# Patient Record
Sex: Male | Born: 1985 | Hispanic: Yes | Marital: Single | Smoking: Never smoker
Health system: Southern US, Community
[De-identification: ages and names within clinical notes are randomized; demographics above are authoritative.]

---

## 2016-08-08 ENCOUNTER — Other Ambulatory Visit: Payer: Self-pay

## 2016-08-08 ENCOUNTER — Encounter (HOSPITAL_COMMUNITY): Payer: Self-pay

## 2016-08-08 ENCOUNTER — Encounter (HOSPITAL_COMMUNITY): Payer: Self-pay | Admitting: Emergency Medicine

## 2016-08-08 ENCOUNTER — Emergency Department (HOSPITAL_COMMUNITY)
Admission: EM | Admit: 2016-08-08 | Discharge: 2016-08-08 | Disposition: A | Discharge status: Home / Self Care | Payer: Self-pay | Attending: Emergency Medicine | Admitting: Emergency Medicine

## 2016-08-08 ENCOUNTER — Emergency Department (HOSPITAL_COMMUNITY)
Admission: EM | Admit: 2016-08-08 | Discharge: 2016-08-09 | Disposition: A | Discharge status: Home / Self Care | Payer: Self-pay | Attending: Emergency Medicine | Admitting: Emergency Medicine

## 2016-08-08 ENCOUNTER — Emergency Department (HOSPITAL_COMMUNITY): Payer: Self-pay

## 2016-08-08 DIAGNOSIS — R0600 Dyspnea, unspecified: Secondary | ICD-10-CM | POA: Insufficient documentation

## 2016-08-08 DIAGNOSIS — B349 Viral infection, unspecified: Secondary | ICD-10-CM | POA: Insufficient documentation

## 2016-08-08 DIAGNOSIS — Z5321 Procedure and treatment not carried out due to patient leaving prior to being seen by health care provider: Secondary | ICD-10-CM | POA: Insufficient documentation

## 2016-08-08 DIAGNOSIS — R0602 Shortness of breath: Secondary | ICD-10-CM | POA: Insufficient documentation

## 2016-08-08 LAB — BASIC METABOLIC PANEL
ANION GAP: 14 (ref 5–15)
BUN: 8 mg/dL (ref 6–20)
CHLORIDE: 99 mmol/L — AB (ref 101–111)
CO2: 23 mmol/L (ref 22–32)
Calcium: 10.2 mg/dL (ref 8.9–10.3)
Creatinine, Ser: 1.01 mg/dL (ref 0.61–1.24)
GFR calc Af Amer: >60 mL/min (ref >60)
GFR calc non Af Amer: >60 mL/min (ref >60)
GLUCOSE: 120 mg/dL — AB (ref 65–99)
POTASSIUM: 3.4 mmol/L — AB (ref 3.5–5.1)
Sodium: 136 mmol/L (ref 135–145)

## 2016-08-08 LAB — CBC
HEMATOCRIT: 46.4 % (ref 39.0–52.0)
HEMOGLOBIN: 16.3 g/dL (ref 13.0–17.0)
MCH: 31.3 pg (ref 26.0–34.0)
MCHC: 35.1 g/dL (ref 30.0–36.0)
MCV: 89.2 fL (ref 78.0–100.0)
Platelets: 307 10*3/uL (ref 150–400)
RBC: 5.2 MIL/uL (ref 4.22–5.81)
RDW: 13.4 % (ref 11.5–15.5)
WBC: 11.9 10*3/uL — ABNORMAL HIGH (ref 4.0–10.5)

## 2016-08-08 LAB — I-STAT TROPONIN, ED: Troponin i, poc: 0 ng/mL (ref 0.00–0.08)

## 2016-08-08 NOTE — ED Triage Notes (Signed)
Pt states that since last night he has had SOB. Denies URI. Alert and oriented. 97% RA.

## 2016-08-08 NOTE — ED Triage Notes (Signed)
Onset yesterday shortness of breath, worse today.   No cough/cold symptoms, fever, or chest pain.

## 2016-08-09 MED ORDER — POTASSIUM CHLORIDE CRYS ER 20 MEQ PO TBCR
40.0000 meq | EXTENDED_RELEASE_TABLET | Freq: Once | ORAL | Status: AC
  Administered 2016-08-09: 40 meq via ORAL
  Filled 2016-08-09: qty 2
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
## 2016-08-09 NOTE — ED Notes (Signed)
Pt understood dc material. NAD Noted 

## 2020-10-20 ENCOUNTER — Other Ambulatory Visit: Payer: Self-pay

## 2020-10-20 ENCOUNTER — Emergency Department (HOSPITAL_COMMUNITY)
Admission: EM | Admit: 2020-10-20 | Discharge: 2020-10-20 | Discharge status: Against Medical Advice | Payer: HRSA Program | Attending: Emergency Medicine | Admitting: Emergency Medicine

## 2020-10-20 DIAGNOSIS — Z5321 Procedure and treatment not carried out due to patient leaving prior to being seen by health care provider: Secondary | ICD-10-CM | POA: Insufficient documentation

## 2020-10-20 DIAGNOSIS — U071 COVID-19: Secondary | ICD-10-CM | POA: Insufficient documentation

## 2020-10-20 DIAGNOSIS — J029 Acute pharyngitis, unspecified: Secondary | ICD-10-CM | POA: Diagnosis not present

## 2020-10-20 DIAGNOSIS — R059 Cough, unspecified: Secondary | ICD-10-CM | POA: Diagnosis not present

## 2020-10-20 LAB — POC SARS CORONAVIRUS 2 AG -  ED: SARS Coronavirus 2 Ag: POSITIVE — AB

## 2020-10-20 NOTE — ED Notes (Signed)
Called pt name x4 for VS. No response from pt.

## 2020-10-20 NOTE — ED Triage Notes (Signed)
Pt reports sore throat, body aches, and dry cough since this morning.

## 2020-11-17 ENCOUNTER — Emergency Department (HOSPITAL_COMMUNITY): Admission: EM | Admit: 2020-11-17 | Discharge: 2020-11-17 | Discharge status: Against Medical Advice | Payer: Self-pay

## 2020-11-17 ENCOUNTER — Other Ambulatory Visit: Payer: Self-pay

## 2020-11-17 NOTE — ED Notes (Signed)
Pt called three different times for triage, no answer

## 2021-05-09 ENCOUNTER — Emergency Department (HOSPITAL_COMMUNITY): Payer: Self-pay

## 2021-05-09 ENCOUNTER — Emergency Department (HOSPITAL_COMMUNITY)
Admission: EM | Admit: 2021-05-09 | Discharge: 2021-05-10 | Disposition: A | Discharge status: Home / Self Care | Payer: Self-pay | Attending: Physician Assistant | Admitting: Physician Assistant

## 2021-05-09 ENCOUNTER — Other Ambulatory Visit: Payer: Self-pay

## 2021-05-09 DIAGNOSIS — R1011 Right upper quadrant pain: Secondary | ICD-10-CM | POA: Insufficient documentation

## 2021-05-09 DIAGNOSIS — Z5321 Procedure and treatment not carried out due to patient leaving prior to being seen by health care provider: Secondary | ICD-10-CM | POA: Insufficient documentation

## 2021-05-09 DIAGNOSIS — R6883 Chills (without fever): Secondary | ICD-10-CM | POA: Insufficient documentation

## 2021-05-09 DIAGNOSIS — R112 Nausea with vomiting, unspecified: Secondary | ICD-10-CM | POA: Insufficient documentation

## 2021-05-09 LAB — CBC WITH DIFFERENTIAL/PLATELET
Abs Immature Granulocytes: 0.03 10*3/uL (ref 0.00–0.07)
Basophils Absolute: 0.1 10*3/uL (ref 0.0–0.1)
Basophils Relative: 1 %
Eosinophils Absolute: 0.4 10*3/uL (ref 0.0–0.5)
Eosinophils Relative: 4 %
HCT: 42.3 % (ref 39.0–52.0)
Hemoglobin: 13.6 g/dL (ref 13.0–17.0)
Immature Granulocytes: 0 %
Lymphocytes Relative: 21 %
Lymphs Abs: 2 10*3/uL (ref 0.7–4.0)
MCH: 29.4 pg (ref 26.0–34.0)
MCHC: 32.2 g/dL (ref 30.0–36.0)
MCV: 91.4 fL (ref 80.0–100.0)
Monocytes Absolute: 1 10*3/uL (ref 0.1–1.0)
Monocytes Relative: 11 %
Neutro Abs: 6 10*3/uL (ref 1.7–7.7)
Neutrophils Relative %: 63 %
Platelets: 285 10*3/uL (ref 150–400)
RBC: 4.63 MIL/uL (ref 4.22–5.81)
RDW: 14.7 % (ref 11.5–15.5)
WBC: 9.5 10*3/uL (ref 4.0–10.5)
nRBC: 0 % (ref 0.0–0.2)

## 2021-05-09 LAB — URINALYSIS, ROUTINE W REFLEX MICROSCOPIC
Bilirubin Urine: NEGATIVE
Glucose, UA: NEGATIVE mg/dL
Hgb urine dipstick: NEGATIVE
Ketones, ur: NEGATIVE mg/dL
Leukocytes,Ua: NEGATIVE
Nitrite: NEGATIVE
Protein, ur: NEGATIVE mg/dL
Specific Gravity, Urine: 1.023 (ref 1.005–1.030)
pH: 6 (ref 5.0–8.0)

## 2021-05-09 LAB — COMPREHENSIVE METABOLIC PANEL
ALT: 312 U/L — ABNORMAL HIGH (ref 0–44)
AST: 233 U/L — ABNORMAL HIGH (ref 15–41)
Albumin: 3.6 g/dL (ref 3.5–5.0)
Alkaline Phosphatase: 67 U/L (ref 38–126)
Anion gap: 9 (ref 5–15)
BUN: 8 mg/dL (ref 6–20)
CO2: 23 mmol/L (ref 22–32)
Calcium: 8.9 mg/dL (ref 8.9–10.3)
Chloride: 104 mmol/L (ref 98–111)
Creatinine, Ser: 0.78 mg/dL (ref 0.61–1.24)
GFR, Estimated: >60 mL/min (ref >60)
Glucose, Bld: 132 mg/dL — ABNORMAL HIGH (ref 70–99)
Potassium: 3.4 mmol/L — ABNORMAL LOW (ref 3.5–5.1)
Sodium: 136 mmol/L (ref 135–145)
Total Bilirubin: 0.8 mg/dL (ref 0.3–1.2)
Total Protein: 7.2 g/dL (ref 6.5–8.1)

## 2021-05-09 LAB — LIPASE, BLOOD: Lipase: 42 U/L (ref 11–51)

## 2021-05-09 NOTE — ED Provider Notes (Signed)
Emergency Medicine Provider Triage Evaluation Note  Kyle Perkins , a 35 y.o. male  was evaluated in triage.  Pt complains of upper abdominal pain.  Patient states he had an episode of abdominal pain last Saturday.  States that he did not think anything of it and his symptoms resolved.  Yesterday began experiencing a similar pain.  It has been constant but worsens after eating as well.  Endorses intermittent nausea/vomiting as well as chills when his pain worsens.  No fevers, diarrhea, lower abdominal pain.  Denies regular NSAID use or alcohol use.  Physical Exam  Ht 5\' 6"  (1.676 m)   Wt 75 kg   BMI 26.69 kg/m  Gen:   Awake, no distress   Resp:  Normal effort  MSK:   Moves extremities without difficulty  Other:  Abdomen is flat and soft.  Moderate tenderness noted diffusely across the upper abdomen.  Medical Decision Making  Medically screening exam initiated at 7:29 PM.  Appropriate orders placed.  Kyle Perkins was informed that the remainder of the evaluation will be completed by another provider, this initial triage assessment does not replace that evaluation, and the importance of remaining in the ED until their evaluation is complete.   Georgia Dom, PA-C 05/09/21 1930    05/11/21, MD 05/12/21 [phone]

## 2021-05-09 NOTE — ED Triage Notes (Signed)
Pt arrives POV for eval of abd pain x 1 week, worse w/ eating, had one episode last Saturday, and has been constant in nature since yesterday afternoon. Endorses N/V, chills.

## 2021-05-10 NOTE — ED Notes (Signed)
Pt called 2x no answer 

## 2021-05-10 NOTE — ED Notes (Signed)
Pt called 3x no answer  

## 2022-12-26 IMAGING — US US ABDOMEN LIMITED
1 series · 14 of 25 positions shown · non-contrast
Comparison: None.

CLINICAL DATA: Right upper quadrant pain x1 week.

EXAM:
ULTRASOUND ABDOMEN LIMITED RIGHT UPPER QUADRANT

[Series 1: us abdomen limited ruq (liver/gb) · 14 of 55 slices shown]
[im 1/55]
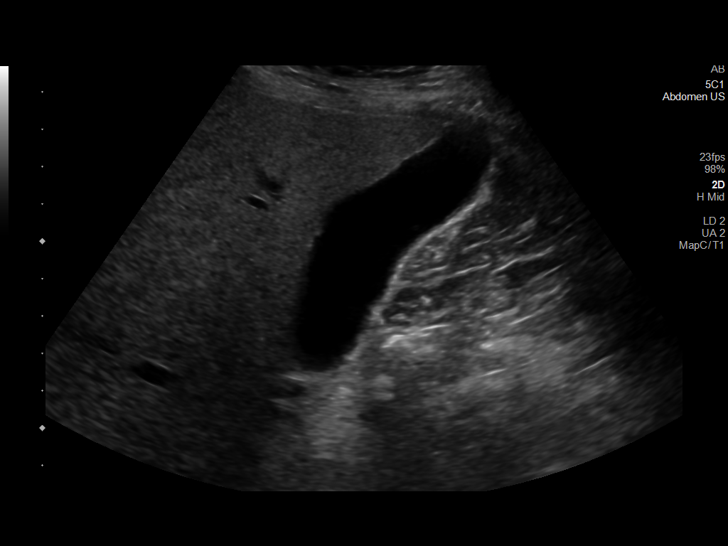
[im 5/55]
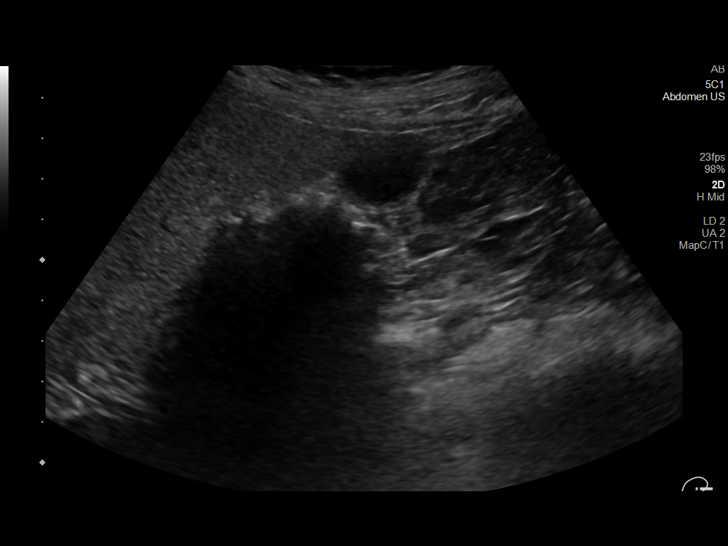
[im 10/55]
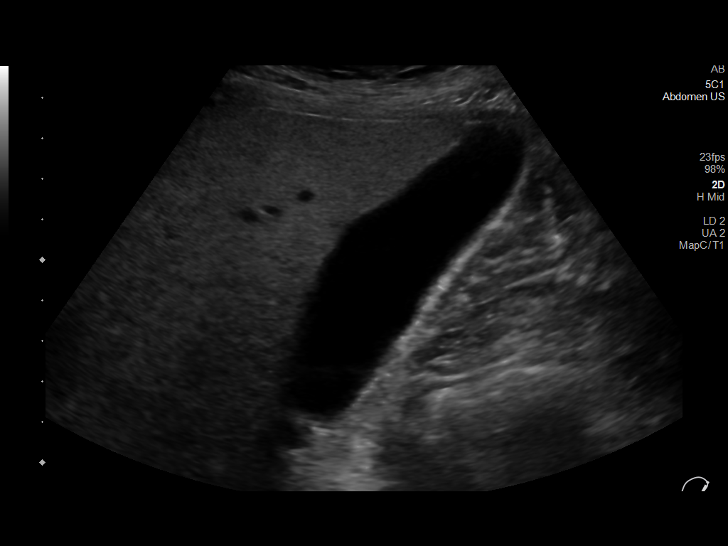
[im 14/55]
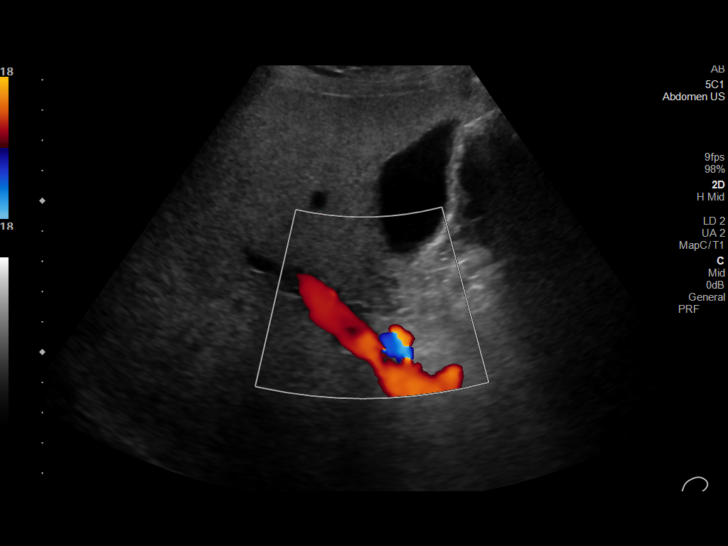
[im 19/55]
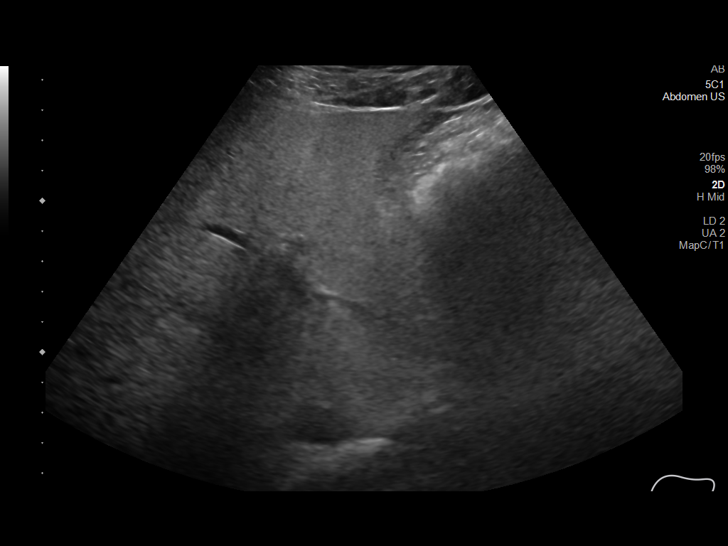
[im 21/55]
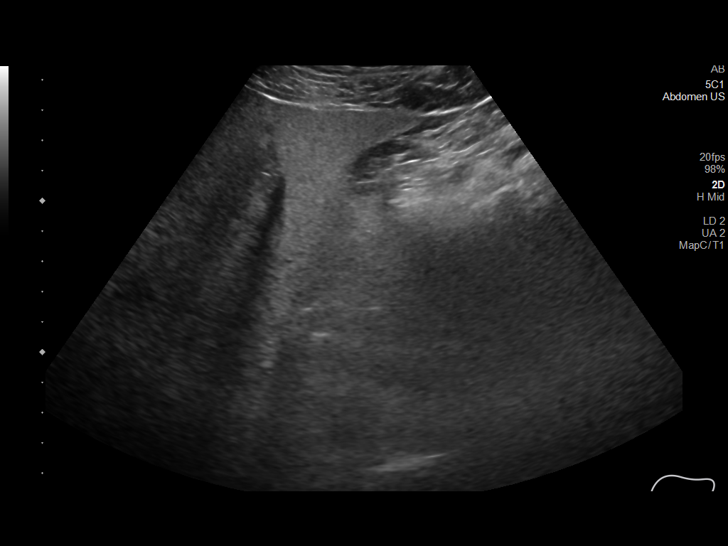
[im 25/55]
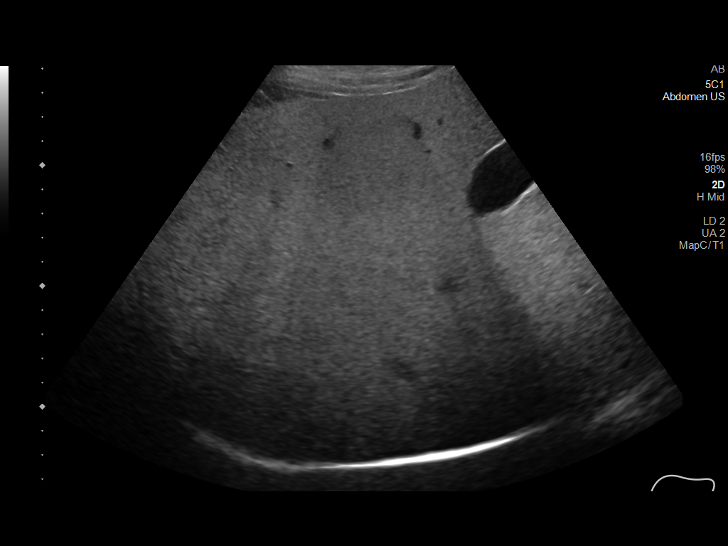
[im 30/55]
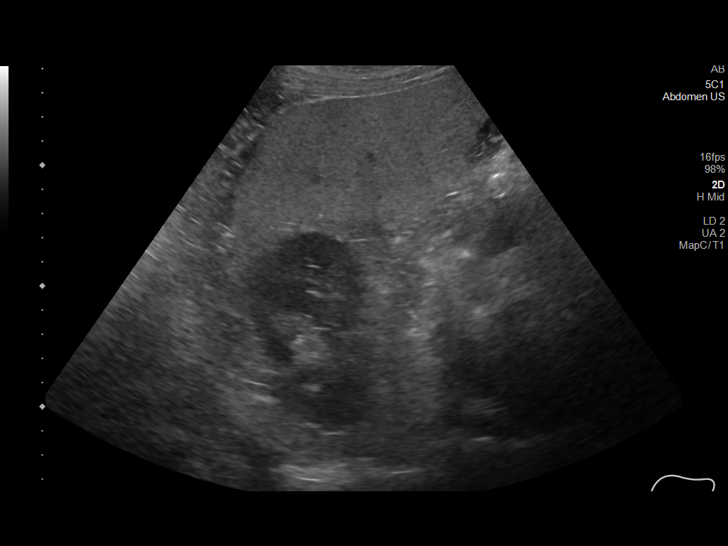
[im 34/55]
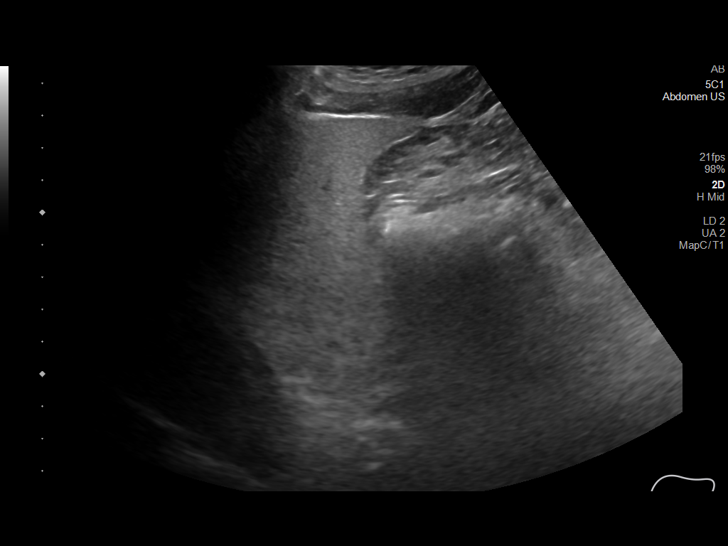
[im 37/55]
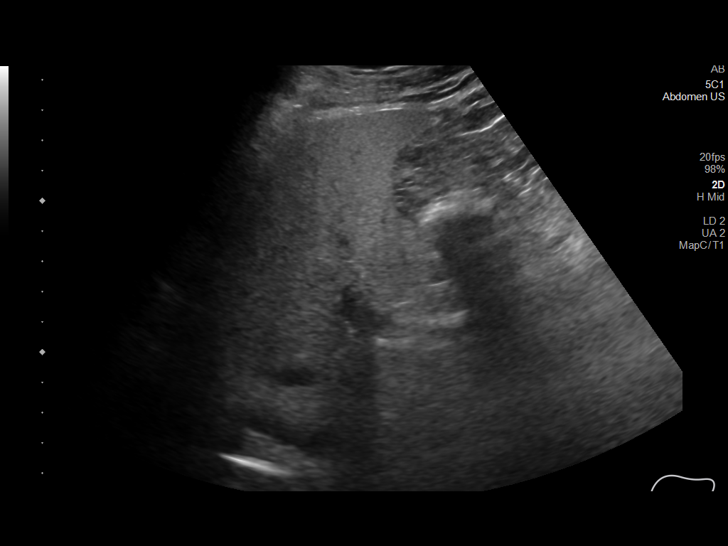
[im 41/55]
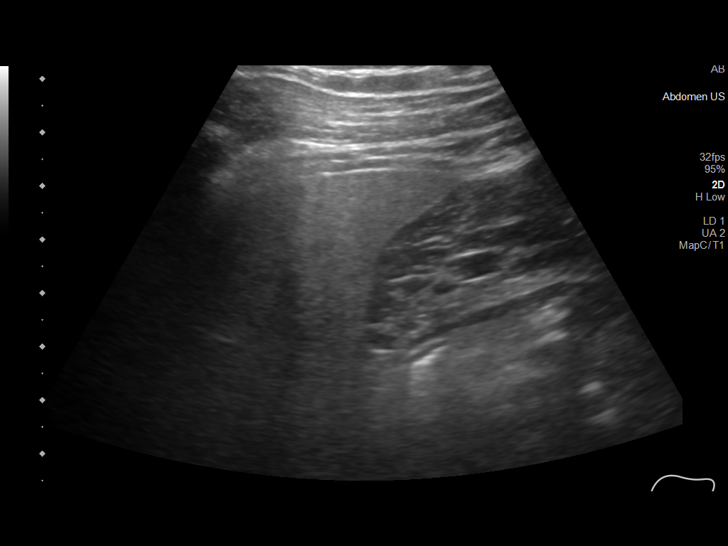
[im 46/55]
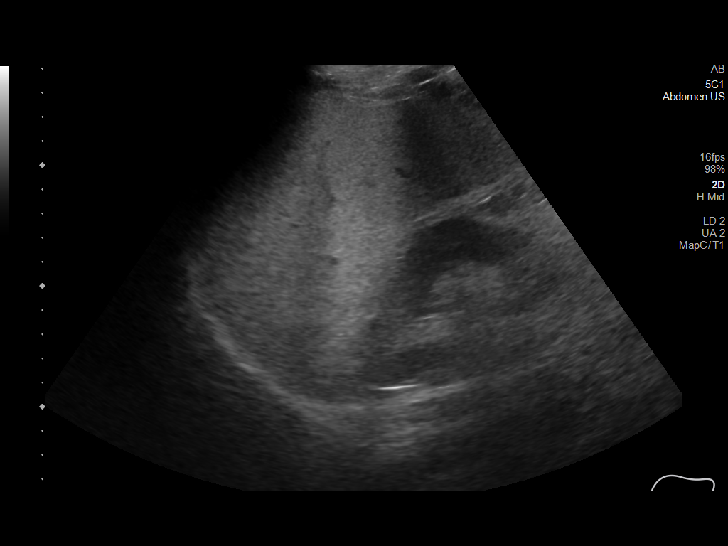
[im 50/55]
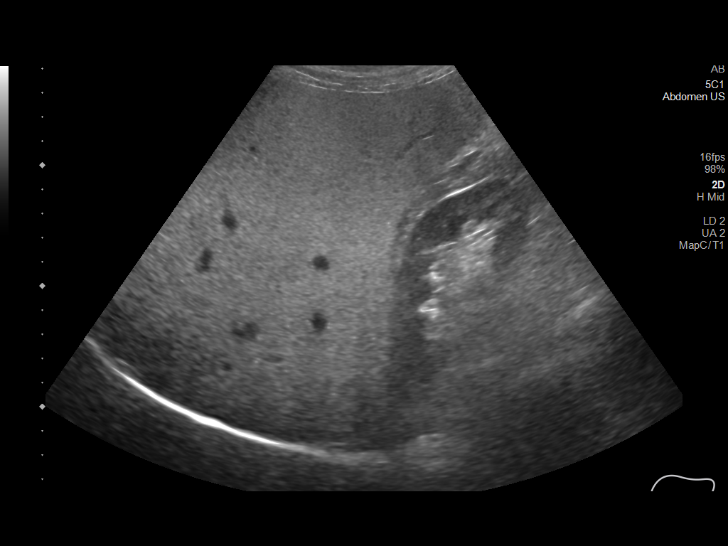
[im 55/55]
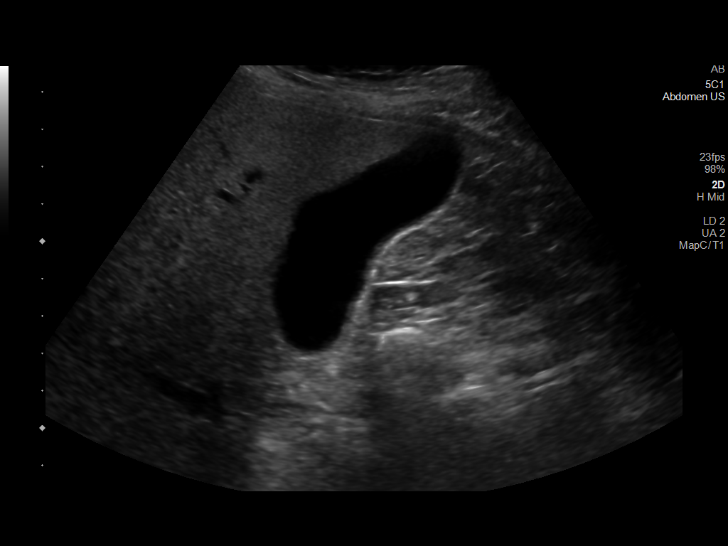

[14 of 25 positions shown; findings below may reference images not displayed]

FINDINGS: Gallbladder:

No gallstones or wall thickening visualized (1.2 mm). No sonographic
Murphy sign noted by sonographer.

Common bile duct:

Diameter: 3.1 mm

Liver:

No focal lesion identified. Diffusely increased echogenicity of the
liver parenchyma is noted. Portal vein is patent on color Doppler
imaging with normal direction of blood flow towards the liver.

Other: None.
IMPRESSION: Fatty liver.

## 2023-06-05 ENCOUNTER — Emergency Department (HOSPITAL_COMMUNITY)
Admission: EM | Admit: 2023-06-05 | Discharge: 2023-06-05 | Disposition: A | Discharge status: Home / Self Care | Payer: Self-pay | Attending: Emergency Medicine | Admitting: Emergency Medicine

## 2023-06-05 ENCOUNTER — Encounter (HOSPITAL_COMMUNITY): Payer: Self-pay

## 2023-06-05 ENCOUNTER — Other Ambulatory Visit: Payer: Self-pay

## 2023-06-05 DIAGNOSIS — K920 Hematemesis: Secondary | ICD-10-CM | POA: Insufficient documentation

## 2023-06-05 DIAGNOSIS — F1099 Alcohol use, unspecified with unspecified alcohol-induced disorder: Secondary | ICD-10-CM | POA: Insufficient documentation

## 2023-06-05 DIAGNOSIS — Z789 Other specified health status: Secondary | ICD-10-CM

## 2023-06-05 DIAGNOSIS — R1013 Epigastric pain: Secondary | ICD-10-CM | POA: Insufficient documentation

## 2023-06-05 LAB — HEPATIC FUNCTION PANEL
ALT: 95 U/L — ABNORMAL HIGH (ref 0–44)
AST: 233 U/L — ABNORMAL HIGH (ref 15–41)
Albumin: 3.3 g/dL — ABNORMAL LOW (ref 3.5–5.0)
Alkaline Phosphatase: 111 U/L (ref 38–126)
Bilirubin, Direct: 0.3 mg/dL — ABNORMAL HIGH (ref 0.0–0.2)
Indirect Bilirubin: 0.7 mg/dL (ref 0.3–0.9)
Total Bilirubin: 1 mg/dL (ref 0.3–1.2)
Total Protein: 8.7 g/dL — ABNORMAL HIGH (ref 6.5–8.1)

## 2023-06-05 LAB — BASIC METABOLIC PANEL
Anion gap: 14 (ref 5–15)
BUN: 5 mg/dL — ABNORMAL LOW (ref 6–20)
CO2: 23 mmol/L (ref 22–32)
Calcium: 9.2 mg/dL (ref 8.9–10.3)
Chloride: 103 mmol/L (ref 98–111)
Creatinine, Ser: 0.95 mg/dL (ref 0.61–1.24)
GFR, Estimated: >60 mL/min (ref >60)
Glucose, Bld: 105 mg/dL — ABNORMAL HIGH (ref 70–99)
Potassium: 4.1 mmol/L (ref 3.5–5.1)
Sodium: 140 mmol/L (ref 135–145)

## 2023-06-05 LAB — URINALYSIS, ROUTINE W REFLEX MICROSCOPIC
Bilirubin Urine: NEGATIVE
Glucose, UA: NEGATIVE mg/dL
Hgb urine dipstick: NEGATIVE
Ketones, ur: NEGATIVE mg/dL
Nitrite: NEGATIVE
Protein, ur: NEGATIVE mg/dL
Specific Gravity, Urine: 1.017 (ref 1.005–1.030)
pH: 6 (ref 5.0–8.0)

## 2023-06-05 LAB — CBC WITH DIFFERENTIAL/PLATELET
Abs Immature Granulocytes: 0.03 10*3/uL (ref 0.00–0.07)
Basophils Absolute: 0.2 10*3/uL — ABNORMAL HIGH (ref 0.0–0.1)
Basophils Relative: 2 %
Eosinophils Absolute: 0.4 10*3/uL (ref 0.0–0.5)
Eosinophils Relative: 5 %
HCT: 43.5 % (ref 39.0–52.0)
Hemoglobin: 14.5 g/dL (ref 13.0–17.0)
Immature Granulocytes: 0 %
Lymphocytes Relative: 19 %
Lymphs Abs: 1.6 10*3/uL (ref 0.7–4.0)
MCH: 31.7 pg (ref 26.0–34.0)
MCHC: 33.3 g/dL (ref 30.0–36.0)
MCV: 95 fL (ref 80.0–100.0)
Monocytes Absolute: 0.9 10*3/uL (ref 0.1–1.0)
Monocytes Relative: 11 %
Neutro Abs: 5.5 10*3/uL (ref 1.7–7.7)
Neutrophils Relative %: 63 %
Platelets: 228 10*3/uL (ref 150–400)
RBC: 4.58 MIL/uL (ref 4.22–5.81)
RDW: 13.7 % (ref 11.5–15.5)
WBC: 8.6 10*3/uL (ref 4.0–10.5)
nRBC: 0 % (ref 0.0–0.2)

## 2023-06-05 LAB — LIPASE, BLOOD: Lipase: 46 U/L (ref 11–51)

## 2023-06-05 MED ORDER — ONDANSETRON 4 MG PO TBDP
4.0000 mg | ORAL_TABLET | Freq: Once | ORAL | Status: AC
  Administered 2023-06-05: 4 mg via ORAL
  Filled 2023-06-05: qty 1

## 2023-06-05 MED ORDER — PANTOPRAZOLE SODIUM 40 MG PO TBEC
40.0000 mg | DELAYED_RELEASE_TABLET | Freq: Once | ORAL | Status: AC
  Administered 2023-06-05: 40 mg via ORAL
  Filled 2023-06-05: qty 1

## 2023-06-05 MED ORDER — FAMOTIDINE 20 MG PO TABS: 20.0000 mg | ORAL_TABLET | Freq: Two times a day (BID) | ORAL | 0 refills | Status: AC

## 2023-06-05 MED ORDER — PANTOPRAZOLE SODIUM 20 MG PO TBEC: 20.0000 mg | DELAYED_RELEASE_TABLET | Freq: Every day | ORAL | 1 refills | Status: AC

## 2023-06-05 MED ORDER — ONDANSETRON 4 MG PO TBDP: 4.0000 mg | ORAL_TABLET | Freq: Three times a day (TID) | ORAL | 0 refills | Status: AC | PRN

## 2023-06-05 NOTE — ED Provider Notes (Signed)
[place] EMERGENCY DEPARTMENT AT [facility] Provider Note   CSN: [id number] Arrival date & time: 06/05/23  1143     History  No chief complaint on file.   Kyle Perkins is a 37 y.o. male.  Patient with history of heavy alcohol use presents the emergency department for vomiting including hematemesis.  Patient has been vomiting over the past several days.  Last vomited blood, approximately 2 tablespoons, this morning.  He continues to drink alcohol.  He denies abdominal pain.  No fevers.  No treatments for his symptoms.  No history of abdominal surgeries or known peptic ulcers.  Family over states that he has not cut down on drinking alcohol.  Right upper quadrant ultrasound in July 2022 showed hepatic steatosis.  Denies heavy NSAID use.       Home Medications Prior to Admission medications   Not on File      Allergies    Prednisone    Review of Systems   Review of Systems  Physical Exam Updated Vital Signs BP 130/86   Pulse 92   Temp 98.6 F (37 C) (Oral)   Resp 17   Ht 5\' 7"  (1.702 m)   Wt 78.5 kg   SpO2 96%   BMI 27.10 kg/m  Physical Exam Vitals and nursing note reviewed.  Constitutional:      General: He is not in acute distress.    Appearance: He is well-developed.  HENT:     Head: Normocephalic and atraumatic.  Eyes:     General:        Right eye: No discharge.        Left eye: No discharge.     Conjunctiva/sclera: Conjunctivae normal.  Cardiovascular:     Rate and Rhythm: Normal rate and regular rhythm.     Heart sounds: Normal heart sounds.  Pulmonary:     Effort: Pulmonary effort is normal.     Breath sounds: Normal breath sounds.  Abdominal:     Palpations: Abdomen is soft.     Tenderness: There is no abdominal tenderness. There is no guarding or rebound.  Musculoskeletal:     Cervical back: Normal range of motion and neck supple.  Skin:    General: Skin is warm and dry.  Neurological:     Mental Status: He is alert.      ED Results / Procedures / Treatments   Labs (all labs ordered are listed, but only abnormal results are displayed) Labs Reviewed  CBC WITH DIFFERENTIAL/PLATELET - Abnormal; Notable for the following components:      Result Value   Basophils Absolute 0.2 (*)    All other components within normal limits  BASIC METABOLIC PANEL - Abnormal; Notable for the following components:   Glucose, Bld 105 (*)    BUN 5 (*)    All other components within normal limits  LIPASE, BLOOD  URINALYSIS, ROUTINE W REFLEX MICROSCOPIC  HEPATIC FUNCTION PANEL    EKG None  Radiology No results found.  Procedures Procedures    Medications Ordered in ED Medications - No data to display  ED Course/ Medical Decision Making/ A&P    Patient seen and examined. History obtained directly from patient and family member at bedside who interprets, with patient permission.  Labs/EKG: Ordered CBC with normal hemoglobin and normal white blood cell count; BMP with normal electrolytes and kidney function, lipase normal.  Will need to check liver function testing, this was ordered.  Imaging: None ordered  Medications/Fluids: Ordered: ODT  Zofran.   Most recent vital signs reviewed and are as follows: BP 130/86   Pulse 92   Temp 98.6 F (37 C) (Oral)   Resp 17   Ht 5\' 7"  (1.702 m)   Wt 78.5 kg   SpO2 96%   BMI 27.10 kg/m   Initial impression: Patient with likely alcoholic gastritis, possible peptic ulcer disease.  No history of esophageal varices.  Appears quite stable at this time.  Will treat nausea and p.o. challenge.  Awaiting LFTs.  3:27 PM Reassessment performed. Patient appears stable continue to wait LFTs.  Have spoken with lab.   Plan: Signout to [employer] at shift change. Follow-up LFTs.   Anticipate discharge to home.  Prescriptions written for Protonix, Pepcid, Zofran.  I have encouraged patient to gradually cut back on alcohol.                                  Medical Decision  Making Amount and/or Complexity of Data Reviewed Labs: ordered.  Risk Prescription drug management.   For this patient's complaint of abdominal pain, the following conditions were considered on the differential diagnosis: gastritis/PUD, enteritis/duodenitis, appendicitis, cholelithiasis/cholecystitis, cholangitis, pancreatitis, ruptured viscus, colitis, diverticulitis, small/large bowel obstruction, proctitis, cystitis, pyelonephritis, ureteral colic, aortic dissection, aortic aneurysm. Atypical chest etiologies were also considered including ACS, PE, and pneumonia.  Patient with history of fatty liver.  Overall low concern for esophageal varices or large-volume hemorrhage today.  Normal hemoglobin.  Symptoms controlled in the ED.  Will treat symptomatically as this is most likely alcoholic gastritis or potentially peptic ulcer disease.  Patient will need follow-up as outpatient.           Final Clinical Impression(s) / ED Diagnoses Final diagnoses:  Hematemesis with nausea  Epigastric pain  Alcohol use    Rx / DC Orders ED Discharge Orders     None         Renne Crigler, PA-C 06/05/23 1529    Arby Barrette, MD 06/08/23 [phone]

## 2023-06-05 NOTE — ED Triage Notes (Signed)
Pt/translator stated, throwing up blood for a week and half . Denies any other symptoms.

## 2023-06-05 NOTE — ED Notes (Signed)
Patient name was call x3, no response.  Don't see patient in lobby

## 2023-06-05 NOTE — ED Notes (Signed)
No nausea and vomiting reported at this time

## 2023-06-05 NOTE — Discharge Instructions (Addendum)
Please read and follow all provided instructions.  Your diagnoses today include:  1. Hematemesis with nausea   2. Epigastric pain   3. Alcohol use     Tests performed today include: Blood cell counts and platelets Kidney and liver function tests Pancreas function test (called lipase) Urine test to look for infection Vital signs. See below for your results today.   Medications prescribed:  Pantoprazole - stomach acid reducer  Pepcid (famotidine) - antihistamine  Zofran - for nausea   DO NOT exceed:  20mg  Pepcid every 12 hours  Take any prescribed medications only as directed.  Home care instructions:  Follow any educational materials contained in this packet.  Follow-up instructions: Please follow-up with your primary care provider in the next 7 days for further evaluation of your symptoms.  I have also referred you to gastroenterology or the stomach specialists for further evaluation.   Return instructions:  SEEK IMMEDIATE MEDICAL ATTENTION IF: The pain does not go away or becomes severe  A temperature above 101F develops  Repeated vomiting occurs (multiple episodes)  The pain becomes localized to portions of the abdomen. The right side could possibly be appendicitis. In an adult, the left lower portion of the abdomen could be colitis or diverticulitis.  Repeated blood is being passed in stools or vomit (bright red or black tarry stools)  You develop chest pain, difficulty breathing, dizziness or fainting, or become confused, poorly responsive, or inconsolable (young children) If you have any other emergent concerns regarding your health  Additional Information: Abdominal (belly) pain can be caused by many things. Your caregiver performed an examination and possibly ordered blood/urine tests and imaging (CT scan, x-rays, ultrasound). Many cases can be observed and treated at home after initial evaluation in the emergency department. Even though you are being discharged  home, abdominal pain can be unpredictable. Therefore, you need a repeated exam if your pain does not resolve, returns, or worsens. Most patients with abdominal pain don't have to be admitted to the hospital or have surgery, but serious problems like appendicitis and gallbladder attacks can start out as nonspecific pain. Many abdominal conditions cannot be diagnosed in one visit, so follow-up evaluations are very important.  Your vital signs today were: BP 130/86   Pulse 92   Temp 98.6 F (37 C) (Oral)   Resp 17   Ht 5\' 7"  (1.702 m)   Wt 78.5 kg   SpO2 96%   BMI 27.10 kg/m  If your blood pressure (bp) was elevated above 135/85 this visit, please have this repeated by your doctor within one month. --------------  Lea y siga todas las instrucciones proporcionadas.  Sus diagnsticos de hoy incluyen:  1. Hematemesis con nuseas.  2. Dolor epigstrico  3. Consumo de alcohol    Las pruebas realizadas hoy incluyen:  Recuentos de clulas sanguneas y plaquetas.  Pruebas de funcin renal y heptica.  Prueba de funcin del pncreas (llamada lipasa)  Anlisis de orina para buscar infeccin.  Signos vitales. Vea a continuacin sus resultados de [employer].   Medicamentos recetados:   Pantoprazol - reductor de cido del estmago   Pepcid (famotidina) - antihistamnico   Zofran - para las nuseas   NO exceda:   20 mg de Pepcid cada 12 horas  Tome cualquier medicamento recetado slo segn las indicaciones.  Instrucciones de cuidado en el hogar:   Siga todos los materiales educativos contenidos en este paquete.  Instrucciones de seguimiento: Haga un seguimiento con su proveedor de Marine scientist en  los prximos 7 das para una evaluacin adicional de sus sntomas.  Tambin lo he remitido a Glass blower/designer para una evaluacin adicional.   Instrucciones de devolucin:  BUSQUE ATENCIN MDICA INMEDIATA SI:  El dolor no desaparece o se vuelve intenso.   Se  desarrolla una temperatura superior a 101F   Se producen vmitos repetidos (mltiples episodios).   El dolor se localiza en partes del abdomen. El lado derecho posiblemente podra ser apendicitis. En un adulto, la porcin inferior izquierda del abdomen podra ser colitis o diverticulitis.   Se elimina sangre repetidamente en las heces o en el vmito (heces alquitranadas de color rojo brillante o negro).   Presenta dolor en el pecho, dificultad para respirar, mareos o desmayos, o se siente confundido, no responde bien o est inconsolable (nios pequeos).  Si tiene alguna otra inquietud emergente con respecto a su salud  Informacin adicional: El dolor abdominal (vientre) puede deberse a Psychologist, clinical. Su mdico realiz un examen y posiblemente orden anlisis de sangre/orina e imgenes (tomografa computarizada, radiografas, ultrasonido). Muchos casos pueden observarse y tratarse en casa despus de una evaluacin inicial en el departamento de emergencias. Aunque le den el alta, el dolor abdominal puede ser impredecible. Por lo tanto, necesitar repetir el examen si su dolor no se resuelve, regresa o Harrington Challenger. La mayora de los pacientes con dolor abdominal no necesitan ser hospitalizados ni someterse a [place], pero problemas graves como apendicitis y ataques de vescula biliar pueden comenzar como un dolor inespecfico. Muchas afecciones abdominales no se pueden diagnosticar en una sola visita, por lo que las evaluaciones de seguimiento son Engineer, production.  Sus signos vitales hoy fueron: BP 130/86  Pulso 92  Temperatura 37 C (98,6 F) (oral)  Resp. 17  Estatura 5\' 7"  (1,702 m)  Peso 78,5 kg  SpO2 96%  IMC 27,10 kg/m  Si su presin arterial (pb) estuvo elevada por encima de 135/85 en esta visita, haga que su mdico la repita dentro de un mes. --------------

## 2023-06-05 NOTE — ED Provider Notes (Signed)
Accepted handoff at shift change from [facility], [place]. Please see prior provider note for more detail.   Briefly: Patient is 37 y.o. who presented to ED with epigastric pain, nausea, vomiting, one episode very minimal hematemesis. Given Pepcid, Protonix, Zofran. Does have history frequent alcohol use. Well appearing, stable, unremarkable labs. Pending LFTs and anticipated discharge home with primary care follow up.   1618 - LFTs are elevated.  Suspect in the setting of patient's chronic alcohol use.  No active vomiting in the ED.  Reports minimal amounts of hematemesis.  Vital signs stable, labs unremarkable with normal hemoglobin.  Prescriptions ordered by previous provider.  Will refer to GI for further outpatient management and patient given strict ED return precautions.     Richardson Dopp 06/05/23 1619    Glyn Ade, MD 06/06/23 [phone]

## 2023-06-05 NOTE — ED Triage Notes (Addendum)
Pt. Stated,  

## 2023-06-06 ENCOUNTER — Encounter: Payer: Self-pay | Admitting: Physician Assistant

## 2023-08-17 NOTE — Progress Notes (Deleted)
08/17/2023 Kyle Perkins [id number] 11/17/1985  Referring provider: Tonette Lederer, PA-C Primary GI doctor: {acdocs:[id number]}  ASSESSMENT AND PLAN:   Assessment and Plan              Patient Care Team: Pcp, No as PCP - General  HISTORY OF PRESENT ILLNESS: 37 y.o. male with a past medical history of ***and others listed below presents for evaluation of ***.   06/05/2023 ER visit with hematemesis, nausea, epigastric pain in setting of alcohol use. Labs reviewed from the ER show no anemia Hgb 14.5 normal platelets negative lipase normal kidney, AST 233, ALT 95, alk phos 111, total bilirubin 1.0, albumin 3.3.  (2 years ago AST 233, ALT 312).  Patient given Zofran and started on pantoprazole 40 mg daily 05/09/2021 RUQ [place] for abdominal pain showed diffusely increased echogenicity of liver parenchyma normal gallbladder normal CBD. Discussed the use of AI scribe software for clinical note transcription with the patient, who gave verbal consent to proceed.  History of Present Illness             He {Actions; denies-reports:120008} blood thinner use.  He {Actions; denies-reports:120008} NSAID use.  He {Actions; denies-reports:120008} ETOH use.   He {Actions; denies-reports:120008} tobacco use.  He {Actions; denies-reports:120008} drug use.    He  reports that he has never smoked. He has never used smokeless tobacco. He reports current alcohol use of about 6.0 standard drinks of alcohol per week. He reports that he does not use drugs.  RELEVANT LABS AND IMAGING:  Results          CBC    Component Value Date/Time   WBC 8.6 06/05/2023 1224   RBC 4.58 06/05/2023 1224   HGB 14.5 06/05/2023 1224   HCT 43.5 06/05/2023 1224   PLT 228 06/05/2023 1224   MCV 95.0 06/05/2023 1224   MCH 31.7 06/05/2023 1224   MCHC 33.3 06/05/2023 1224   RDW 13.7 06/05/2023 1224   LYMPHSABS 1.6 06/05/2023 1224   MONOABS 0.9 06/05/2023 1224   EOSABS 0.4 06/05/2023 1224   BASOSABS 0.2 (H)  06/05/2023 1224   Recent Labs    06/05/23 1224  HGB 14.5    CMP     Component Value Date/Time   NA 140 06/05/2023 1224   K 4.1 06/05/2023 1224   CL 103 06/05/2023 1224   CO2 23 06/05/2023 1224   GLUCOSE 105 (H) 06/05/2023 1224   BUN 5 (L) 06/05/2023 1224   CREATININE 0.95 06/05/2023 1224   CALCIUM 9.2 06/05/2023 1224   PROT 8.7 (H) 06/05/2023 1234   ALBUMIN 3.3 (L) 06/05/2023 1234   AST 233 (H) 06/05/2023 1234   ALT 95 (H) 06/05/2023 1234   ALKPHOS 111 06/05/2023 1234   BILITOT 1.0 06/05/2023 1234   GFRNONAA >60 06/05/2023 1224   GFRAA >60 08/08/2016 2244      Latest Ref Rng & Units 06/05/2023   12:34 PM 05/09/2021    8:40 PM  Hepatic Function  Total Protein 6.5 - 8.1 g/dL 8.7  7.2   Albumin 3.5 - 5.0 g/dL 3.3  3.6   AST 15 - 41 U/L 233  233   ALT 0 - 44 U/L 95  312   Alk Phosphatase 38 - 126 U/L 111  67   Total Bilirubin 0.3 - 1.2 mg/dL 1.0  0.8   Bilirubin, Direct 0.0 - 0.2 mg/dL 0.3        Current Medications:        Current Outpatient  Medications (Other):    famotidine (PEPCID) 20 MG tablet, Take 1 tablet (20 mg total) by mouth 2 (two) times daily.   ondansetron (ZOFRAN-ODT) 4 MG disintegrating tablet, Take 1 tablet (4 mg total) by mouth every 8 (eight) hours as needed for nausea or vomiting.   pantoprazole (PROTONIX) 20 MG tablet, Take 1 tablet (20 mg total) by mouth daily.  Medical History: No past medical history on file. Allergies:  Allergies  Allergen Reactions   Prednisone      Surgical History:  He  has no past surgical history on file. Family History:  His family history is not on file.  REVIEW OF SYSTEMS  : All other systems reviewed and negative except where noted in the History of Present Illness.  PHYSICAL EXAM: There were no vitals taken for this visit. General Appearance: Well nourished, in no apparent distress. Head:   Normocephalic and atraumatic. Eyes:  sclerae anicteric,conjunctive pink  Respiratory: Respiratory effort  normal, BS equal bilaterally without rales, rhonchi, wheezing. Cardio: RRR with no MRGs. Peripheral pulses intact.  Abdomen: Soft,  {BlankSingle:19197::"Flat","Obese","Non-distended"} ,active bowel sounds. {actendernessAB:27319} tenderness {anatomy; site abdomen:5010}. {BlankMultiple:19196::"Without guarding","With guarding","Without rebound","With rebound"}. No masses. Rectal: {acrectalexam:27461} Musculoskeletal: Full ROM, {PSY - GAIT AND STATION:22860} gait. {With/Without:304960234} edema. Skin:  Dry and intact without significant lesions or rashes Neuro: Alert and  oriented x4;  No focal deficits. Psych:  Cooperative. Normal mood and affect.    Doree Albee, PA-C 1:32 PM

## 2023-08-18 ENCOUNTER — Ambulatory Visit: Payer: Self-pay | Admitting: Physician Assistant

## 2024-02-06 ENCOUNTER — Emergency Department (HOSPITAL_COMMUNITY)
Admission: EM | Admit: 2024-02-06 | Discharge: 2024-02-07 | Disposition: A | Discharge status: Home / Self Care | Payer: Self-pay | Attending: Emergency Medicine | Admitting: Emergency Medicine

## 2024-02-06 ENCOUNTER — Other Ambulatory Visit: Payer: Self-pay

## 2024-02-06 ENCOUNTER — Encounter (HOSPITAL_COMMUNITY): Payer: Self-pay

## 2024-02-06 DIAGNOSIS — R4182 Altered mental status, unspecified: Secondary | ICD-10-CM | POA: Insufficient documentation

## 2024-02-06 DIAGNOSIS — F10920 Alcohol use, unspecified with intoxication, uncomplicated: Secondary | ICD-10-CM

## 2024-02-06 DIAGNOSIS — R519 Headache, unspecified: Secondary | ICD-10-CM | POA: Insufficient documentation

## 2024-02-06 DIAGNOSIS — R1032 Left lower quadrant pain: Secondary | ICD-10-CM | POA: Insufficient documentation

## 2024-02-06 DIAGNOSIS — Y907 Blood alcohol level of 200-239 mg/100 ml: Secondary | ICD-10-CM | POA: Insufficient documentation

## 2024-02-06 DIAGNOSIS — M545 Low back pain, unspecified: Secondary | ICD-10-CM | POA: Insufficient documentation

## 2024-02-06 DIAGNOSIS — F1010 Alcohol abuse, uncomplicated: Secondary | ICD-10-CM | POA: Insufficient documentation

## 2024-02-06 LAB — CBG MONITORING, ED: Glucose-Capillary: 109 mg/dL — ABNORMAL HIGH (ref 70–99)

## 2024-02-06 MED ORDER — AMMONIA AROMATIC IN INHA
RESPIRATORY_TRACT | Status: AC
  Filled 2024-02-06: qty 10

## 2024-02-06 NOTE — ED Triage Notes (Signed)
 [facility] called out for unresponsive person tonight. EMS arrived and pt was in passenger seat and not responsive. Vitals signs all stable. EMS not able to get any background on what was going on with pt. 2 of narcan given.

## 2024-02-07 ENCOUNTER — Emergency Department (HOSPITAL_COMMUNITY): Payer: Self-pay

## 2024-02-07 ENCOUNTER — Encounter (HOSPITAL_COMMUNITY): Payer: Self-pay

## 2024-02-07 LAB — COMPREHENSIVE METABOLIC PANEL WITH GFR
ALT: 97 U/L — ABNORMAL HIGH (ref 0–44)
AST: 190 U/L — ABNORMAL HIGH (ref 15–41)
Albumin: 3.5 g/dL (ref 3.5–5.0)
Alkaline Phosphatase: 98 U/L (ref 38–126)
Anion gap: 13 (ref 5–15)
BUN: 5 mg/dL — ABNORMAL LOW (ref 6–20)
CO2: 24 mmol/L (ref 22–32)
Calcium: 8.5 mg/dL — ABNORMAL LOW (ref 8.9–10.3)
Chloride: 104 mmol/L (ref 98–111)
Creatinine, Ser: 0.64 mg/dL (ref 0.61–1.24)
GFR, Estimated: >60 mL/min (ref >60)
Glucose, Bld: 105 mg/dL — ABNORMAL HIGH (ref 70–99)
Potassium: 3.4 mmol/L — ABNORMAL LOW (ref 3.5–5.1)
Sodium: 141 mmol/L (ref 135–145)
Total Bilirubin: 0.8 mg/dL (ref 0.0–1.2)
Total Protein: 8.4 g/dL — ABNORMAL HIGH (ref 6.5–8.1)

## 2024-02-07 LAB — TROPONIN I (HIGH SENSITIVITY): Troponin I (High Sensitivity): 4 ng/L (ref <18)

## 2024-02-07 LAB — RAPID URINE DRUG SCREEN, HOSP PERFORMED
Amphetamines: POSITIVE — AB
Barbiturates: NOT DETECTED
Benzodiazepines: NOT DETECTED
Cocaine: NOT DETECTED
Opiates: NOT DETECTED
Tetrahydrocannabinol: NOT DETECTED

## 2024-02-07 LAB — CBC WITH DIFFERENTIAL/PLATELET
Abs Immature Granulocytes: 0.02 10*3/uL (ref 0.00–0.07)
Basophils Absolute: 0.1 10*3/uL (ref 0.0–0.1)
Basophils Relative: 2 %
Eosinophils Absolute: 0.2 10*3/uL (ref 0.0–0.5)
Eosinophils Relative: 3 %
HCT: 44.6 % (ref 39.0–52.0)
Hemoglobin: 15.3 g/dL (ref 13.0–17.0)
Immature Granulocytes: 0 %
Lymphocytes Relative: 31 %
Lymphs Abs: 2.4 10*3/uL (ref 0.7–4.0)
MCH: 32.7 pg (ref 26.0–34.0)
MCHC: 34.3 g/dL (ref 30.0–36.0)
MCV: 95.3 fL (ref 80.0–100.0)
Monocytes Absolute: 1.2 10*3/uL — ABNORMAL HIGH (ref 0.1–1.0)
Monocytes Relative: 16 %
Neutro Abs: 3.7 10*3/uL (ref 1.7–7.7)
Neutrophils Relative %: 48 %
Platelets: 193 10*3/uL (ref 150–400)
RBC: 4.68 MIL/uL (ref 4.22–5.81)
RDW: 14 % (ref 11.5–15.5)
WBC: 7.6 10*3/uL (ref 4.0–10.5)
nRBC: 0 % (ref 0.0–0.2)

## 2024-02-07 LAB — ETHANOL: Alcohol, Ethyl (B): 237 mg/dL — ABNORMAL HIGH (ref <15)

## 2024-02-07 MED ORDER — ACETAMINOPHEN 500 MG PO TABS
1000.0000 mg | ORAL_TABLET | Freq: Once | ORAL | Status: AC
  Administered 2024-02-07: 1000 mg via ORAL
  Filled 2024-02-07: qty 2

## 2024-02-07 MED ORDER — IOHEXOL 300 MG/ML  SOLN
100.0000 mL | Freq: Once | INTRAMUSCULAR | Status: AC | PRN
  Administered 2024-02-07: 100 mL via INTRAVENOUS

## 2024-02-07 NOTE — Discharge Instructions (Addendum)
 Your test results today are reassuring.  If you would like help with substance abuse, a list of resources is attached.  Return to the emergency department for any new or worsening symptoms of concern.

## 2024-02-07 NOTE — ED Notes (Signed)
 Obtained bladder scan on pt right after he was done urinating. Bladder scanner showed 29mL, after the urinal.

## 2024-02-07 NOTE — ED Notes (Signed)
 Offered pt drink and food, and he refused at this time. Pt in room with family member now.

## 2024-02-07 NOTE — ED Notes (Signed)
 Pt just used the bathroom before bladder scan.

## 2024-02-07 NOTE — ED Notes (Signed)
 Pt got up and walked to bathroom and back to room with no trouble or help needed.

## 2024-02-07 NOTE — ED Provider Notes (Signed)
 [place] EMERGENCY DEPARTMENT AT [facility] Provider Note   CSN: [id number] Arrival date & time: 02/06/24  2342     History {Add pertinent medical, surgical, social history, OB history to HPI:1} Chief Complaint  Patient presents with  . Altered Level of Consciousness     Kyle Perkins is a 38 y.o. male.  38 yo M here with ams. Found in a vehicle unresponsive. Narcan didn't help. VS otherwise WNL en route. Ultimately able to wake patient up. States he had a little bit of alcohol to drink. Denies trauma. Denies drugs. Endorses left posterior headache, left lower abdominal pain and midline lumbar back pain.        Home Medications Prior to Admission medications   Medication Sig Start Date End Date Taking? Authorizing Provider  famotidine  (PEPCID ) 20 MG tablet Take 1 tablet (20 mg total) by mouth 2 (two) times daily. 06/05/23   Lyna Sandhoff, PA-C  ondansetron  (ZOFRAN -ODT) 4 MG disintegrating tablet Take 1 tablet (4 mg total) by mouth every 8 (eight) hours as needed for nausea or vomiting. 06/05/23   Lyna Sandhoff, PA-C  pantoprazole  (PROTONIX ) 20 MG tablet Take 1 tablet (20 mg total) by mouth daily. 06/05/23   Lyna Sandhoff, PA-C      Allergies    Prednisone    Review of Systems   Review of Systems  Physical Exam Updated Vital Signs BP (!) 149/97   Pulse 89   Temp 97.9 F (36.6 C) (Axillary)   Resp 12   SpO2 94%  Physical Exam Vitals and nursing note reviewed.  Constitutional:      Appearance: He is well-developed.  HENT:     Head: Normocephalic and atraumatic.  Eyes:     Extraocular Movements: Extraocular movements intact.     Pupils: Pupils are equal, round, and reactive to light.  Cardiovascular:     Rate and Rhythm: Normal rate.  Pulmonary:     Effort: Pulmonary effort is normal. No respiratory distress.  Abdominal:     General: There is no distension.  Musculoskeletal:        General: Normal range of motion.     Cervical back: Normal range  of motion.  Skin:    General: Skin is warm and dry.  Neurological:     General: No focal deficit present.     Mental Status: He is alert.    ED Results / Procedures / Treatments   Labs (all labs ordered are listed, but only abnormal results are displayed) Labs Reviewed  CBC WITH DIFFERENTIAL/PLATELET - Abnormal; Notable for the following components:      Result Value   Monocytes Absolute 1.2 (*)    All other components within normal limits  COMPREHENSIVE METABOLIC PANEL WITH GFR - Abnormal; Notable for the following components:   Potassium 3.4 (*)    Glucose, Bld 105 (*)    BUN 5 (*)    Calcium 8.5 (*)    Total Protein 8.4 (*)    AST 190 (*)    ALT 97 (*)    All other components within normal limits  ETHANOL - Abnormal; Notable for the following components:   Alcohol, Ethyl (B) 237 (*)    All other components within normal limits  RAPID URINE DRUG SCREEN, HOSP PERFORMED - Abnormal; Notable for the following components:   Amphetamines POSITIVE (*)    All other components within normal limits  CBG MONITORING, ED - Abnormal; Notable for the following components:   Glucose-Capillary 109 (*)  All other components within normal limits  TROPONIN I (HIGH SENSITIVITY)    EKG EKG Interpretation Date/Time:  Monday February 06 2024 23:49:30 EDT Ventricular Rate:  96 PR Interval:  160 QRS Duration:  95 QT Interval:  382 QTC Calculation: 483 R Axis:   11  Text Interpretation: Sinus rhythm ST elev, probable normal early repol pattern Borderline prolonged QT interval improved TW changes from 2017 Confirmed by Eve Hinders [phone]) on 02/07/2024 1:30:36 AM  Radiology No results found.  Procedures Procedures    Medications Ordered in ED Medications  ammonia  inhalant (  Given 02/07/24 0021)  acetaminophen  (TYLENOL ) tablet 1,000 mg (1,000 mg Oral Given 02/07/24 0106)  iohexol  (OMNIPAQUE ) 300 MG/ML solution 100 mL (100 mLs Intravenous Contrast Given 02/07/24 0147)    ED Course/  Medical Decision Making/ A&P                                 Medical Decision Making Amount and/or Complexity of Data Reviewed Labs: ordered. Radiology: ordered. ECG/medicine tests: ordered.  Risk OTC drugs. Prescription drug management.  Unclear cause for symptoms so uds,etoh, trauma workup initiated.   Etoh significantly elevated. Also pos for amphetamines.  ***  {Document critical care time when appropriate:1} {Document review of labs and clinical decision tools ie heart score, Chads2Vasc2 etc:1}  {Document your independent review of radiology images, and any outside records:1} {Document your discussion with family members, caretakers, and with consultants:1} {Document social determinants of health affecting pt's care:1} {Document your decision making why or why not admission, treatments were needed:1} Final Clinical Impression(s) / ED Diagnoses Final diagnoses:  None    Rx / DC Orders ED Discharge Orders     None

## 2024-02-07 NOTE — ED Provider Notes (Signed)
 Care of patient assumed from Dr. Luberta Ruse.  This patient presented last night after being found unresponsive in his vehicle.  He has since returned to mental baseline.  He had some abdominal tenderness on arrival and CT imaging is pending. Physical Exam  BP (!) 145/98 (BP Location: Right Arm)   Pulse 72   Temp 98.1 F (36.7 C) (Oral)   Resp 14   SpO2 99%   Physical Exam Vitals and nursing note reviewed.  Constitutional:      General: He is not in acute distress.    Appearance: Normal appearance. He is well-developed. He is not ill-appearing, toxic-appearing or diaphoretic.  HENT:     Head: Normocephalic and atraumatic.     Right Ear: External ear normal.     Left Ear: External ear normal.     Nose: Nose normal.     Mouth/Throat:     Mouth: Mucous membranes are moist.  Eyes:     Extraocular Movements: Extraocular movements intact.     Conjunctiva/sclera: Conjunctivae normal.  Cardiovascular:     Rate and Rhythm: Normal rate and regular rhythm.  Pulmonary:     Effort: Pulmonary effort is normal. No respiratory distress.  Abdominal:     General: There is no distension.     Palpations: Abdomen is soft.     Tenderness: There is no abdominal tenderness.  Musculoskeletal:        General: No swelling or deformity. Normal range of motion.     Cervical back: Normal range of motion and neck supple.  Skin:    General: Skin is warm and dry.     Coloration: Skin is not jaundiced or pale.  Neurological:     General: No focal deficit present.     Mental Status: He is alert and oriented to person, place, and time.     Cranial Nerves: No cranial nerve deficit.     Sensory: No sensory deficit.     Motor: No weakness.     Coordination: Coordination normal.  Psychiatric:        Mood and Affect: Mood normal.        Behavior: Behavior normal.     Procedures  Procedures  ED Course / MDM    Medical Decision Making Amount and/or Complexity of Data Reviewed Labs: ordered. Radiology:  ordered. ECG/medicine tests: ordered.  Risk OTC drugs. Prescription drug management.   On assessment, patient is awake and alert.  He is accompanied by his wife at bedside.  He denies any current physical complaints.  He has no focal neurologic deficits.  Patient does endorse to drinking alcohol last night.  He denies any other coingestions.  His urinalysis is positive for amphetamines.  His CT of abdomen and pelvis does show a distended bladder.  Patient was able to urinate 1 L of urine and postvoid residual showed only 29 cc of bladder volume.  CT scan was otherwise unremarkable.  He does have gallstones but no evidence of cholecystitis.  He denies any abdominal pain at this time.  Patient has no known chronic conditions.  Notably, he has no known history of seizures.  Etiology of his episode last night could be from intoxication.  His ethanol level today was 237.  His lab work shows elevations in transaminases, similar to prior lab work, suggestive of chronic alcoholism.  Withdrawal seizures unlikely given his elevated ethanol level today.  Coingestions likely given presence of amphetamines on his urine.  Patient was able to ambulate without difficulty.  He was discharged stable condition.       Iva Mariner, MD 02/07/24 [phone]
